# Patient Record
Sex: Male | Born: 1980 | Hispanic: Yes | Marital: Single | Smoking: Never smoker
Health system: Southern US, Community
[De-identification: ages and names within clinical notes are randomized; demographics above are authoritative.]

---

## 2012-07-26 ENCOUNTER — Ambulatory Visit (INDEPENDENT_AMBULATORY_CARE_PROVIDER_SITE_OTHER): Payer: BC Managed Care – PPO | Admitting: Family Medicine

## 2012-07-26 VITALS — BP 123/67 | HR 79 | Temp 97.9°F | Resp 18 | Ht 67.5 in | Wt 161.4 lb

## 2012-07-26 DIAGNOSIS — Z0000 Encounter for general adult medical examination without abnormal findings: Secondary | ICD-10-CM

## 2012-07-26 LAB — POCT CBC
Granulocyte percent: 63.1 %G (ref 37–80)
HCT, POC: 45.4 % (ref 43.5–53.7)
MID (cbc): 0.8 (ref 0–0.9)
MPV: 9.8 fL (ref 0–99.8)
POC Granulocyte: 6.8 (ref 2–6.9)
POC LYMPH PERCENT: 29.8 %L (ref 10–50)
POC MID %: 7.1 %M (ref 0–12)
Platelet Count, POC: 365 10*3/uL (ref 142–424)
RDW, POC: 13.1 %

## 2012-07-26 LAB — LIPID PANEL
Cholesterol: 128 mg/dL (ref 0–200)
HDL: 51 mg/dL (ref >39)
LDL Cholesterol: 54 mg/dL (ref 0–99)
Triglycerides: 114 mg/dL (ref <150)

## 2012-07-26 LAB — COMPREHENSIVE METABOLIC PANEL
AST: 40 U/L — ABNORMAL HIGH (ref 0–37)
Albumin: 4.5 g/dL (ref 3.5–5.2)
Alkaline Phosphatase: 68 U/L (ref 39–117)
Potassium: 4.4 mEq/L (ref 3.5–5.3)
Sodium: 137 mEq/L (ref 135–145)
Total Bilirubin: 0.6 mg/dL (ref 0.3–1.2)
Total Protein: 6.7 g/dL (ref 6.0–8.3)

## 2012-07-26 NOTE — Progress Notes (Signed)
Subjective:    Patient ID: Nathaniel Mcclure, male    DOB: Nov 03, 1980, 32 y.o.   MRN: [id number] Chief Complaint  Patient presents with  . Employment Physical    HPI  Ate cereal and peanut butter sandwich and 10 and then bugles about an hr ago. Here today with his 2 children. No complaints or concerns.  Feeling great. History reviewed. No pertinent past medical history. History reviewed. No pertinent past surgical history. No current outpatient prescriptions on file prior to visit.   Not on File Family History  Problem Relation Age of Onset  . Cancer - hysterectomy Mother   . Diabetes Maternal Grandmother   MGF - MI in mid-50s  History   Social History  . Marital Status: Single    Spouse Name: N/A    Number of Children: N/A  . Years of Education: N/A   Social History Main Topics  . Smoking status: Never Smoker   . Smokeless tobacco: Never Used  . Alcohol Use: No  . Drug Use: No  . Sexually Active: Yes   Other Topics Concern  . None   Social History Narrative  . None     Review of Systems  Constitutional: Negative for fever, chills, activity change, appetite change and fatigue.  HENT: Negative for ear pain, congestion and sore throat.   Eyes: Negative for visual disturbance.  Respiratory: Negative for cough and shortness of breath.   Cardiovascular: Negative for chest pain and leg swelling.  Gastrointestinal: Negative for nausea, vomiting, abdominal pain, diarrhea and constipation.  Genitourinary: Negative for dysuria.  Musculoskeletal: Negative for myalgias, arthralgias and gait problem.  Skin: Negative for rash.  Neurological: Negative for dizziness, weakness, numbness and headaches.  Psychiatric/Behavioral: Negative for sleep disturbance.      BP 123/67  Pulse 79  Temp 97.9 F (36.6 C)  Resp 18  Ht 5' 7.5" (1.715 m)  Wt 161 lb 6.4 oz (73.211 kg)  BMI 24.91 kg/m2  SpO2 99% Objective:   Physical Exam  Constitutional: He is oriented to person, place, and  time. He appears well-developed and well-nourished. No distress.  HENT:  Head: Normocephalic and atraumatic.  Right Ear: Tympanic membrane, external ear and ear canal normal.  Left Ear: Tympanic membrane, external ear and ear canal normal.  Nose: Nose normal.  Mouth/Throat: Uvula is midline, oropharynx is clear and moist and mucous membranes are normal. No oropharyngeal exudate.  Eyes: Conjunctivae normal are normal. Right eye exhibits no discharge. Left eye exhibits no discharge. No scleral icterus.  Neck: Normal range of motion. Neck supple. No thyromegaly present.  Cardiovascular: Normal rate, regular rhythm, normal heart sounds and intact distal pulses.   Pulmonary/Chest: Effort normal and breath sounds normal. No respiratory distress.  Abdominal: Soft. Bowel sounds are normal. He exhibits no distension and no mass. There is no tenderness. There is no rebound and no guarding.  Musculoskeletal: He exhibits no edema.  Lymphadenopathy:    He has no cervical adenopathy.  Neurological: He is alert and oriented to person, place, and time. He has normal reflexes. No cranial nerve deficit. He exhibits normal muscle tone.  Skin: Skin is warm and dry. No rash noted. He is not diaphoretic. No erythema.  Psychiatric: He has a normal mood and affect. His behavior is normal.          Assessment & Plan:   1. Physical exam, annual  Lipid panel, POCT glucose (manual entry), POCT CBC, Comprehensive metabolic panel, TSH  He is not fasting today but  needs a lipid panel for his insurance paper work and he has been waiting a VERY long time in our office today so will go ahead and check his lipids but if mildly elevated - likely nothing to worry about. If sig elevated, will need to be rechecked.

## 2012-07-28 ENCOUNTER — Encounter: Payer: Self-pay | Admitting: Family Medicine

## 2012-07-29 NOTE — Telephone Encounter (Signed)
Left message for patient to return call.

## 2012-07-29 NOTE — Telephone Encounter (Signed)
Can you release the labs for my chart.

## 2012-07-29 NOTE — Telephone Encounter (Signed)
Can you release  the labs to my chart

## 2012-07-29 NOTE — Telephone Encounter (Signed)
Left message to return call 

## 2012-08-16 ENCOUNTER — Other Ambulatory Visit: Payer: Self-pay

## 2013-05-07 ENCOUNTER — Other Ambulatory Visit: Payer: Self-pay

## 2013-07-31 ENCOUNTER — Ambulatory Visit (INDEPENDENT_AMBULATORY_CARE_PROVIDER_SITE_OTHER): Payer: BC Managed Care – PPO | Admitting: Physician Assistant

## 2013-07-31 ENCOUNTER — Other Ambulatory Visit: Payer: Self-pay | Admitting: Physician Assistant

## 2013-07-31 VITALS — BP 124/78 | HR 62 | Temp 98.0°F | Resp 17 | Ht 69.0 in | Wt 153.0 lb

## 2013-07-31 DIAGNOSIS — Z0000 Encounter for general adult medical examination without abnormal findings: Secondary | ICD-10-CM

## 2013-07-31 DIAGNOSIS — R17 Unspecified jaundice: Secondary | ICD-10-CM

## 2013-07-31 DIAGNOSIS — H6980 Other specified disorders of Eustachian tube, unspecified ear: Secondary | ICD-10-CM

## 2013-07-31 DIAGNOSIS — N509 Disorder of male genital organs, unspecified: Secondary | ICD-10-CM

## 2013-07-31 DIAGNOSIS — N62 Hypertrophy of breast: Secondary | ICD-10-CM

## 2013-07-31 LAB — POCT CBC
GRANULOCYTE PERCENT: 56.1 % (ref 37–80)
HEMATOCRIT: 43.1 % — AB (ref 43.5–53.7)
Hemoglobin: 13.6 g/dL — AB (ref 14.1–18.1)
LYMPH, POC: 2.6 (ref 0.6–3.4)
MCH: 30.3 pg (ref 27–31.2)
MCHC: 31.6 g/dL — AB (ref 31.8–35.4)
MCV: 95.9 fL (ref 80–97)
MID (cbc): 0.3 (ref 0–0.9)
MPV: 9 fL (ref 0–99.8)
POC Granulocyte: 3.8 (ref 2–6.9)
POC LYMPH %: 39 % (ref 10–50)
POC MID %: 4.9 %M (ref 0–12)
Platelet Count, POC: 260 10*3/uL (ref 142–424)
RBC: 4.49 M/uL — AB (ref 4.69–6.13)
RDW, POC: 13.8 %
WBC: 6.7 10*3/uL (ref 4.6–10.2)

## 2013-07-31 MED ORDER — FLUTICASONE PROPIONATE 50 MCG/ACT NA SUSP: 2.0000 | Freq: Every day | NASAL | Status: DC

## 2013-07-31 NOTE — Progress Notes (Signed)
Patient ID: Nathaniel Mcclure MRN: [id number], DOB: 07/08/1980 33 y.o. Date of Encounter: 07/31/2013, 5:20 PM  Primary Physician: No primary provider on file.  Chief Complaint: Physical (CPE)  HPI: 33 y.o. male with history noted below here for administrative PE. Doing well. Last physical was 07/26/12. Needs physical for insurance.   Patient would like to discuss possible gynecomastia. States he previously took anabolic steroids for 4 years with him last taking them the previous summer. Notes that his left breast is larger than his right. There is some firmness there. He is self conscious regarding this. He would like to visit a surgeon and discuss having this removed.    He also notes a testicular growth about the size of a pea for about 5 years. Has not changed in size over this time span. Sometimes it is painful, but sometimes it is not. The lesion is located along the medial aspect of the left testicle. No prior evaluation or imaging.   Review of Systems: Consitutional: No fever, chills, fatigue, night sweats, lymphadenopathy, or weight changes. Eyes: No visual changes, eye redness, or discharge. ENT/Mouth: Ears: No otalgia, tinnitus, hearing loss, discharge. Nose: No congestion, rhinorrhea, sinus pain, or epistaxis. Throat: No sore throat, post nasal drip, or teeth pain. Cardiovascular: No CP, palpitations, diaphoresis, DOE, edema, orthopnea, PND. Respiratory: No cough, hemoptysis, SOB, or wheezing. Gastrointestinal: No anorexia, dysphagia, reflux, pain, nausea, vomiting, hematemesis, diarrhea, constipation, BRBPR, or melena. Genitourinary: Positive for testicular mass and pain. No dysuria, frequency, urgency, hematuria, incontinence, nocturia, decreased urinary stream, discharge, or impotence. Musculoskeletal: No decreased ROM, myalgias, stiffness, joint swelling, or weakness. Skin: No rash, erythema, lesion changes, pain, warmth, jaundice, or pruritis. Neurological: No headache, dizziness,  syncope, seizures, tremors, memory loss, coordination problems, or paresthesias. Psychological: No anxiety, depression, hallucinations, SI/HI. Endocrine: No fatigue, polydipsia, polyphagia, polyuria, or known diabetes.   History reviewed. No pertinent past medical history.   History reviewed. No pertinent past surgical history.  Home Meds:  Prior to Admission medications   Not on File    Allergies: No Known Allergies  History   Social History  . Marital Status: Single    Spouse Name: N/A    Number of Children: N/A  . Years of Education: N/A   Occupational History  . Not on file.   Social History Main Topics  . Smoking status: Never Smoker   . Smokeless tobacco: Never Used  . Alcohol Use: No  . Drug Use: No  . Sexual Activity: Yes   Other Topics Concern  . Not on file   Social History Narrative  . No narrative on file    Family History  Problem Relation Age of Onset  . Cancer Mother   . Diabetes Maternal Grandmother    Patient is unsure what type cancer his mother had.  Physical Exam: Blood pressure 124/78, pulse 62, temperature 98 F (36.7 C), temperature source Oral, resp. rate 17, height 5\' 9"  (1.753 m), weight 153 lb (69.4 kg), SpO2 100.00%.  General: Well developed, well nourished, in no acute distress. HEENT: Normocephalic, atraumatic. Conjunctiva pink, sclera non-icteric. Pupils 2 mm constricting to 1 mm, round, regular, and equally reactive to light and accomodation. EOMI. Internal auditory canal clear. TMs with good cone of light and without pathology. Nasal mucosa pink. Nares are without discharge. No sinus tenderness. Oral mucosa pink. Dentition normal. Pharynx without exudate.   Neck: Supple. Trachea midline. No thyromegaly. Full ROM. No lymphadenopathy. Lungs: Clear to auscultation bilaterally without wheezes, rales, or rhonchi. Breathing  is of normal effort and unlabored. Cardiovascular: RRR with S1 S2. No murmurs, rubs, or gallops appreciated.  Distal pulses 2+ symmetrically. No carotid or abdominal bruits. Abdomen: Soft, non-tender, non-distended with normoactive bowel sounds. No hepatosplenomegaly or masses. No rebound/guarding. No CVA tenderness. Without hernias. Left breast slight larger than the right. Palpation with the patient supine reveals firmness and bilaterally as well as a firm nodule at 10 o'clock along the left breast. No nipple inversion or discharge.  Genitourinary: Circumcised male. No penile lesions. Testes descended bilaterally. Right testicle smooth without tenderness or masses. Left testicle with a pea sized firm nontender lesion at the superior aspect. No hernia. No varicocele.   Musculoskeletal: Full range of motion and 5/5 strength throughout. Without swelling, atrophy, tenderness, crepitus, or warmth. Extremities without clubbing, cyanosis, or edema. Calves supple. Skin: Warm and moist without erythema, ecchymosis, wounds, or rash. Neuro: A+Ox3. CN II-XII grossly intact. Moves all extremities spontaneously. Full sensation throughout. Normal gait. DTR 2+ throughout upper and lower extremities. Finger to nose intact. Psych:  Responds to questions appropriately with a normal affect.   Studies:  Results for orders placed in visit on 07/31/13  POCT CBC      Result Value Range   WBC 6.7  4.6 - 10.2 K/uL   Lymph, poc 2.6  0.6 - 3.4   POC LYMPH PERCENT 39.0  10 - 50 %L   MID (cbc) 0.3  0 - 0.9   POC MID % 4.9  0 - 12 %M   POC Granulocyte 3.8  2 - 6.9   Granulocyte percent 56.1  37 - 80 %G   RBC 4.49 (*) 4.69 - 6.13 M/uL   Hemoglobin 13.6 (*) 14.1 - 18.1 g/dL   HCT, POC 78.2 (*) 95.6 - 53.7 %   MCV 95.9  80 - 97 fL   MCH, POC 30.3  27 - 31.2 pg   MCHC 31.6 (*) 31.8 - 35.4 g/dL   RDW, POC 21.3     Platelet Count, POC 260  142 - 424 K/uL   MPV 9.0  0 - 99.8 fL    CMET, Lipid, TSH all pending. Patient is not fasting. He last ate around 9 AM this morning.    Assessment/Plan:  33 y.o. male here for CPE with  gynecomastia, testicular lesion, and ETD   1) Gynecomastia  -Chest wall [place] -Await results -Referral to [facility]  2) Testicular lesion -Testicular US -Await results  3) ETD -Flonase 2 sprays each nare daily #1 RF 6  4) CPE -Await labs -Healthy diet and exercise -Weight loss -Age appropriate anticipatory guidance   Signed, Eula Listen, PA-C [facility] [place], [place] [id number] [phone] 07/31/2013 5:20 PM

## 2013-08-01 LAB — COMPREHENSIVE METABOLIC PANEL
ALBUMIN: 4.4 g/dL (ref 3.5–5.2)
ALK PHOS: 73 U/L (ref 39–117)
ALT: 52 U/L (ref 0–53)
AST: 52 U/L — ABNORMAL HIGH (ref 0–37)
BUN: 10 mg/dL (ref 6–23)
CALCIUM: 9.5 mg/dL (ref 8.4–10.5)
CO2: 26 mEq/L (ref 19–32)
Chloride: 103 mEq/L (ref 96–112)
Creat: 0.71 mg/dL (ref 0.50–1.35)
GLUCOSE: 76 mg/dL (ref 70–99)
POTASSIUM: 4.2 meq/L (ref 3.5–5.3)
Sodium: 137 mEq/L (ref 135–145)
Total Bilirubin: 1.5 mg/dL — ABNORMAL HIGH (ref 0.2–1.2)
Total Protein: 6.7 g/dL (ref 6.0–8.3)

## 2013-08-01 LAB — LIPID PANEL
Cholesterol: 134 mg/dL (ref 0–200)
HDL: 60 mg/dL (ref >39)
LDL CALC: 67 mg/dL (ref 0–99)
TRIGLYCERIDES: 35 mg/dL (ref <150)
Total CHOL/HDL Ratio: 2.2 Ratio
VLDL: 7 mg/dL (ref 0–40)

## 2013-08-01 LAB — TSH: TSH: 1.002 u[IU]/mL (ref 0.350–4.500)

## 2013-08-01 NOTE — Addendum Note (Signed)
Addended by: Sondra BargesUNN, Naksh Radi M on: 08/01/2013 08:05 PM   Modules accepted: Orders

## 2013-08-04 LAB — BILIRUBIN, FRACTIONATED(TOT/DIR/INDIR)
BILIRUBIN DIRECT: 0.3 mg/dL (ref 0.0–0.3)
BILIRUBIN TOTAL: 1.5 mg/dL — AB (ref 0.2–1.2)
Indirect Bilirubin: 1.2 mg/dL (ref 0.2–1.2)

## 2013-08-10 ENCOUNTER — Other Ambulatory Visit: Payer: Self-pay | Admitting: Radiology

## 2013-08-10 ENCOUNTER — Other Ambulatory Visit: Payer: Self-pay

## 2013-08-10 ENCOUNTER — Ambulatory Visit
Admission: RE | Admit: 2013-08-10 | Discharge: 2013-08-10 | Disposition: A | Discharge status: Home / Self Care | Payer: BC Managed Care – PPO | Source: Ambulatory Visit | Attending: Physician Assistant | Admitting: Physician Assistant

## 2013-08-10 DIAGNOSIS — N509 Disorder of male genital organs, unspecified: Secondary | ICD-10-CM

## 2013-08-10 DIAGNOSIS — N62 Hypertrophy of breast: Secondary | ICD-10-CM

## 2013-08-12 ENCOUNTER — Ambulatory Visit (INDEPENDENT_AMBULATORY_CARE_PROVIDER_SITE_OTHER): Payer: BC Managed Care – PPO | Admitting: General Surgery

## 2013-08-13 ENCOUNTER — Telehealth: Payer: Self-pay

## 2013-08-13 ENCOUNTER — Encounter: Payer: Self-pay | Admitting: Physician Assistant

## 2013-08-13 NOTE — Telephone Encounter (Signed)
Pt's wife called stating that was some confusion with some appointments and wanted a call back [phone]

## 2013-08-14 NOTE — Telephone Encounter (Signed)
Lm for rtn call 

## 2013-08-15 NOTE — Telephone Encounter (Signed)
Have been talking with pt through mychart.

## 2013-08-17 ENCOUNTER — Ambulatory Visit (INDEPENDENT_AMBULATORY_CARE_PROVIDER_SITE_OTHER): Payer: BC Managed Care – PPO | Admitting: Surgery

## 2013-08-17 ENCOUNTER — Encounter (INDEPENDENT_AMBULATORY_CARE_PROVIDER_SITE_OTHER): Payer: Self-pay | Admitting: Surgery

## 2013-08-17 VITALS — BP 121/76 | HR 78 | Temp 98.2°F | Resp 18 | Ht 67.5 in | Wt 153.0 lb

## 2013-08-17 DIAGNOSIS — N62 Hypertrophy of breast: Secondary | ICD-10-CM | POA: Insufficient documentation

## 2013-08-17 NOTE — Progress Notes (Signed)
Subjective:     Patient ID: Nathaniel Mcclure, male   DOB: 09-07-1980, 33 y.o.   MRN: [id number]  HPI This gentleman is referred to me for evaluation of gynecomastia. He reports that he developed several years ago while taking anabolic steroids. He had some bilateral nipple discomfort which has improved.  He denies nipple discharge. There is no family history of breast cancer  Review of Systems     Objective:   Physical Exam On exam, he has bilateral gynecomastia. The breast tissue is soft without specific masses     Assessment:     Bilateral gynecomastia     Plan:     I explained the diagnosis to him. He would need referral to a plastic surgeon to consider bilateral breast reduction. We do not offer this from a general surgical standpoint

## 2013-08-18 ENCOUNTER — Other Ambulatory Visit: Payer: Self-pay | Admitting: Physician Assistant

## 2013-08-18 DIAGNOSIS — N62 Hypertrophy of breast: Secondary | ICD-10-CM

## 2013-08-18 NOTE — Progress Notes (Signed)
Based on the general surgeon's note I have made the plastic surgery referral for his gynecomastia evaluation and treatment.

## 2013-08-24 ENCOUNTER — Encounter: Payer: Self-pay | Admitting: Physician Assistant

## 2013-08-24 ENCOUNTER — Ambulatory Visit (INDEPENDENT_AMBULATORY_CARE_PROVIDER_SITE_OTHER): Payer: BC Managed Care – PPO | Admitting: Physician Assistant

## 2013-08-24 ENCOUNTER — Ambulatory Visit: Payer: BC Managed Care – PPO

## 2013-08-24 ENCOUNTER — Other Ambulatory Visit: Payer: Self-pay | Admitting: Physician Assistant

## 2013-08-24 VITALS — BP 111/66 | HR 67 | Temp 98.1°F | Resp 18 | Wt 147.0 lb

## 2013-08-24 DIAGNOSIS — R0789 Other chest pain: Secondary | ICD-10-CM

## 2013-08-24 DIAGNOSIS — R071 Chest pain on breathing: Secondary | ICD-10-CM

## 2013-08-24 DIAGNOSIS — N62 Hypertrophy of breast: Secondary | ICD-10-CM

## 2013-08-24 DIAGNOSIS — T148XXA Other injury of unspecified body region, initial encounter: Secondary | ICD-10-CM

## 2013-08-24 MED ORDER — NAPROXEN 500 MG PO TABS: 500.0000 mg | ORAL_TABLET | Freq: Two times a day (BID) | ORAL | Status: DC

## 2013-08-24 NOTE — Progress Notes (Signed)
Subjective:    Patient ID: Nathaniel Mcclure, male    DOB: [birth year], 33 y.o.   MRN: [id number]  HPI Primary Physician: Carola Frost  Chief Complaint: Left sided abdominal pain  HPI: 33 y.o. male with history below presents with 14 day left sided chest wall pain. Patient was doing a leg press at the time when the pain began. He was pushing around 280 pounds and felt a pain along his left chest wall. He stopped exercising and waited around 90 seconds. He began to exercise again but the pain returned so he stopped altogether. The pain is getting better, but he just wanted to have this checked out. He describes discomfort when going from supine to a seated position. He is able to lay without issue. No SOB or coughing. No pain with deep inspiration. Never with any bruising. Has not tried any medications.        No past medical history on file.   Home Meds: Prior to Admission medications   Not on File    Allergies: No Known Allergies  History   Social History  . Marital Status: Single    Spouse Name: N/A    Number of Children: N/A  . Years of Education: N/A   Occupational History  . Not on file.   Social History Main Topics  . Smoking status: Never Smoker   . Smokeless tobacco: Never Used  . Alcohol Use: No  . Drug Use: No  . Sexual Activity: Yes   Other Topics Concern  . Not on file   Social History Narrative  . No narrative on file     Review of Systems  Respiratory: Negative for cough, choking, chest tightness, shortness of breath, wheezing and stridor.   Cardiovascular: Positive for chest pain.       Chest wall pain  Gastrointestinal: Negative for nausea, vomiting, abdominal pain, diarrhea and abdominal distention.       Objective:   Physical Exam  Physical Exam: Blood pressure 111/66, pulse 67, temperature 98.1 F (36.7 C), temperature source Oral, resp. rate 18, weight 147 lb (66.679 kg), SpO2 99.00%., Body mass index is 22.67 kg/(m^2). General: Well  developed, well nourished, in no acute distress. Head: Normocephalic, atraumatic, eyes without discharge, sclera non-icteric, nares are without discharge. Bilateral auditory canals clear, TM's are without perforation, pearly grey and translucent with reflective cone of light bilaterally. Oral cavity moist, posterior pharynx without exudate, erythema, peritonsillar abscess, or post nasal drip. Uvula midline.   Neck: Supple. No thyromegaly. Full ROM. No lymphadenopathy. Lungs: Clear bilaterally to auscultation without wheezes, rales, or rhonchi. Breathing is unlabored. Heart: RRR with S1 S2. No murmurs, rubs, or gallops appreciated. Chest wall: TTP between 5th and 6th ribs along left flank. No ecchymosis or erythema.  Abdomen: Soft, non-tender, non-distended with normoactive bowel sounds. No hepatosplenomegaly. No rebound/guarding. No obvious abdominal masses. Msk:  Strength and tone normal for age. Extremities/Skin: Warm and dry. No clubbing or cyanosis. No edema. No rashes or suspicious lesions. Neuro: Alert and oriented X 3. Moves all extremities spontaneously. Gait is normal. CNII-XII grossly in tact. Psych:  Responds to questions appropriately with a normal affect.    CXR:  [facility] reading (PRIMARY) by  Dr. Neva Seat. No pneumothorax. Negative.       Assessment & Plan:  33 year old male with muscle strain and chest wall pain -Naprosyn 500 mg 1 po bid #60 RF 1 -Rest -Deep breathing exercises  -Advised patient it will take time for this to  heal  -RTC precautions   Eula Listenyan Rosanne Wohlfarth, MHS, PA-C [facility] [address] [place], [place] [id number] [phone] [facility] 08/25/2013 9:25 AM

## 2013-08-27 NOTE — Telephone Encounter (Signed)
I have scheduled this patient for a follow up with Ryan on 09/14/13 @ 1:45pm.

## 2013-09-07 ENCOUNTER — Ambulatory Visit
Admission: RE | Admit: 2013-09-07 | Discharge: 2013-09-07 | Disposition: A | Discharge status: Home / Self Care | Payer: Self-pay | Source: Ambulatory Visit | Attending: Physician Assistant | Admitting: Physician Assistant

## 2013-09-07 DIAGNOSIS — N62 Hypertrophy of breast: Secondary | ICD-10-CM

## 2013-09-14 ENCOUNTER — Ambulatory Visit: Payer: BC Managed Care – PPO | Admitting: Physician Assistant

## 2015-01-18 ENCOUNTER — Telehealth: Payer: Self-pay

## 2015-01-18 ENCOUNTER — Ambulatory Visit (INDEPENDENT_AMBULATORY_CARE_PROVIDER_SITE_OTHER): Payer: BLUE CROSS/BLUE SHIELD | Admitting: Physician Assistant

## 2015-01-18 VITALS — BP 120/66 | HR 84 | Temp 98.9°F | Resp 16 | Ht 67.5 in | Wt 164.0 lb

## 2015-01-18 DIAGNOSIS — N62 Hypertrophy of breast: Secondary | ICD-10-CM

## 2015-01-18 LAB — TSH: TSH: 0.919 u[IU]/mL (ref 0.350–4.500)

## 2015-01-18 LAB — T4, FREE: Free T4: 1.26 ng/dL (ref 0.80–1.80)

## 2015-01-18 LAB — T3, FREE: T3, Free: 3.6 pg/mL (ref 2.3–4.2)

## 2015-01-18 NOTE — Patient Instructions (Addendum)
Moderate 135-145 bpm Vigorous 150-166 bpm

## 2015-01-18 NOTE — Telephone Encounter (Signed)
Received voicemail that pt is requesting lab results. Last time pt had labs drawn were January 2015. Left VM for pt to call back regarding which labs he was requesting.

## 2015-01-18 NOTE — Progress Notes (Signed)
   Subjective:    Patient ID: Nathaniel Mcclure, male    DOB: 07/24/1980, 34 y.o.   MRN: [id number]  HPI Patient presents for labs to be drawn for gynecomastia workup for plastic surgeon Dr Alena Billslaire S. Needs TSH, T3/T4, HCG, LH, estradiol, and testosterone drawn. Denies increased fatigue, depression, anxiety, decreased libido, change in size of testes, discharge, palpitations, CP/SOB, or changes in skin/hair. Developed gynecomastia 5 years ago following use of anabolic steroids which he has stopped. In fact he is not working out at all currently, but would like to start again. PMH negative and not taking any medications.    Review of Systems As noted above.    Objective:   Physical Exam  Constitutional: He is oriented to person, place, and time. He appears well-developed and well-nourished. No distress.  Blood pressure 120/66, pulse 84, temperature 98.9 F (37.2 C), temperature source Oral, resp. rate 16, height 5' 7.5" (1.715 m), weight 164 lb (74.39 kg), SpO2 98 %.  HENT:  Head: Normocephalic and atraumatic.  Right Ear: External ear normal.  Left Ear: External ear normal.  Eyes: Conjunctivae are normal. Right eye exhibits no discharge. Left eye exhibits no discharge. No scleral icterus.  Neck: Normal range of motion. Neck supple.  Cardiovascular: Normal rate, regular rhythm and normal heart sounds.  Exam reveals no gallop and no friction rub.   No murmur heard. Pulmonary/Chest: Effort normal and breath sounds normal. No respiratory distress. He has no wheezes. He has no rales. Right breast exhibits no inverted nipple, no mass, no nipple discharge, no skin change and no tenderness. Left breast exhibits no inverted nipple, no mass, no nipple discharge, no skin change and no tenderness.  Abdominal: Soft. Bowel sounds are normal. He exhibits no distension. There is no tenderness. There is no rebound and no guarding.  Lymphadenopathy:    He has no cervical adenopathy.  Neurological: He is alert and  oriented to person, place, and time.  Skin: Skin is warm and dry. No rash noted. He is not diaphoretic. No erythema. No pallor.  Psychiatric: He has a normal mood and affect. His behavior is normal. Judgment and thought content normal.      Assessment & Plan:  1. Gynecomastia, male - hCG, quantitative, pregnancy - Luteinizing hormone - TSH - T3, free - T4, free - Estradiol - Testosterone; Future   Nathaniel Ridgeishira Fremon Zacharia PA-C  [facility] 01/18/2015 3:08 PM

## 2015-01-18 NOTE — Progress Notes (Signed)
  Medical screening examination/treatment/procedure(s) were performed by non-physician practitioner and as supervising physician I was immediately available for consultation/collaboration.     

## 2015-01-21 LAB — LUTEINIZING HORMONE: LH: 3.3 m[IU]/mL (ref 1.5–9.3)

## 2015-01-21 LAB — HCG, QUANTITATIVE, PREGNANCY: hCG, Beta Chain, Quant, S: <2 m[IU]/mL

## 2015-01-21 LAB — ESTRADIOL: ESTRADIOL: 23.2 pg/mL

## 2015-01-23 ENCOUNTER — Encounter: Payer: Self-pay | Admitting: Physician Assistant

## 2015-04-12 IMAGING — US US SCROTUM
1 series · 14 of 25 positions shown · non-contrast
Comparison: None.

CLINICAL DATA: Left testicular lesion.

EXAM:
ULTRASOUND OF SCROTUM
TECHNIQUE: Complete ultrasound examination of the testicles, epididymis, and
other scrotal structures was performed.

[Series 1: us scrotum · 0.06mm/px · 14 of 42 slices shown]
[im 1/42]
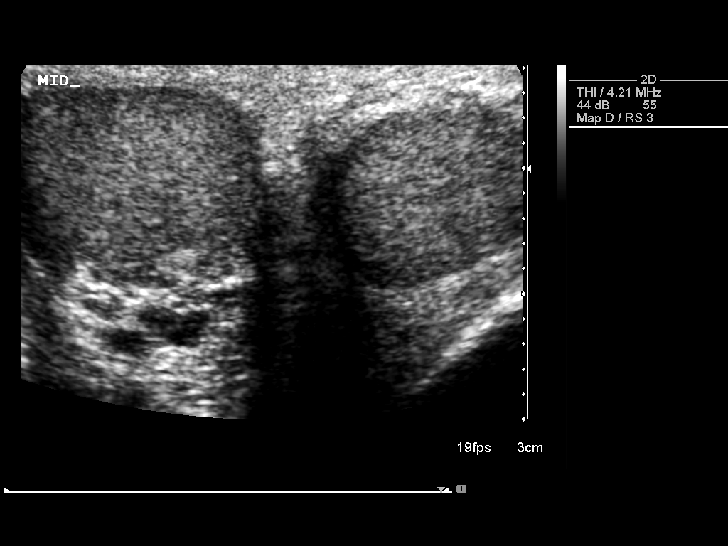
[im 4/42]
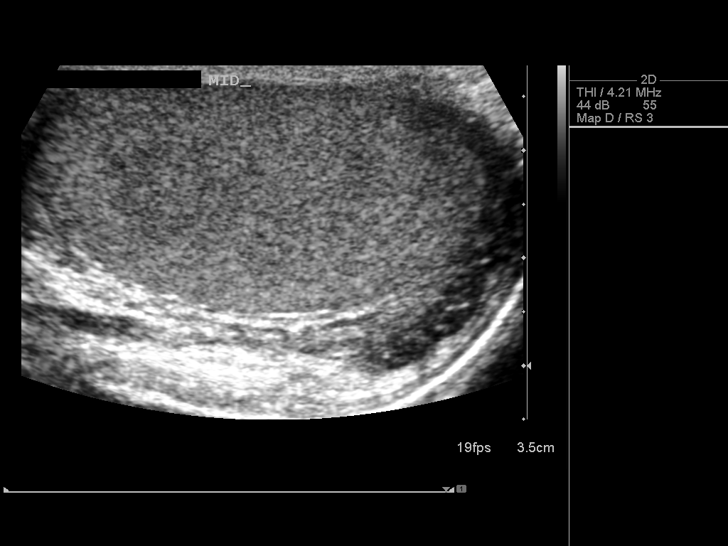
[im 7/42]
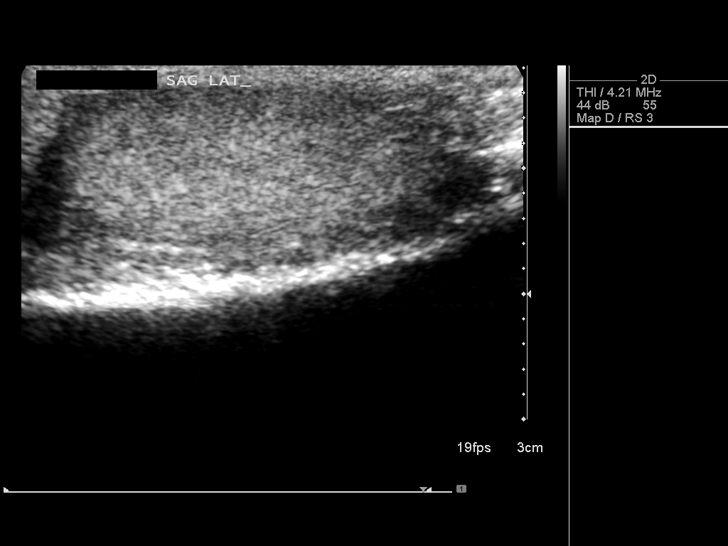
[im 11/42]
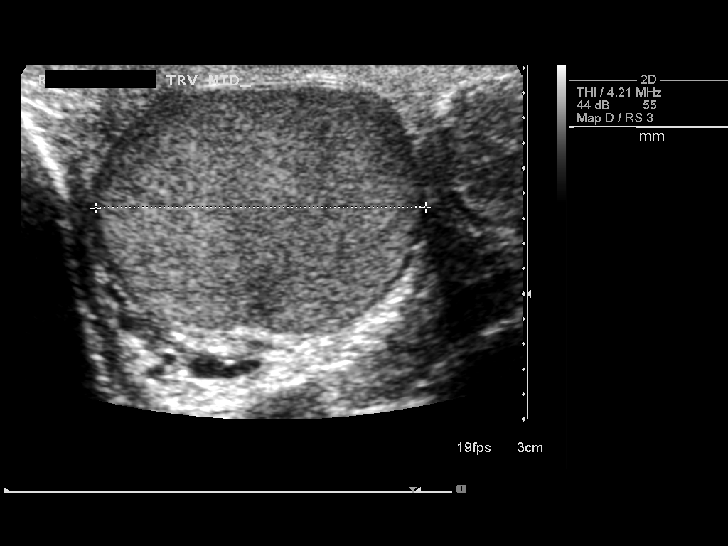
[im 14/42]
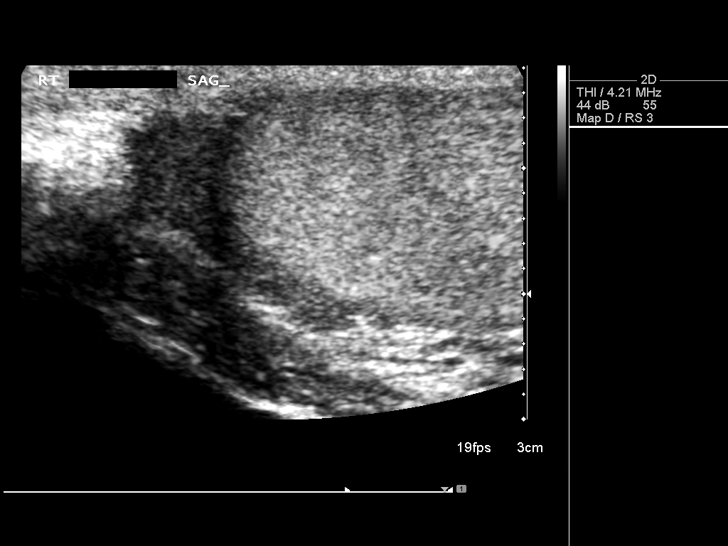
[im 16/42]
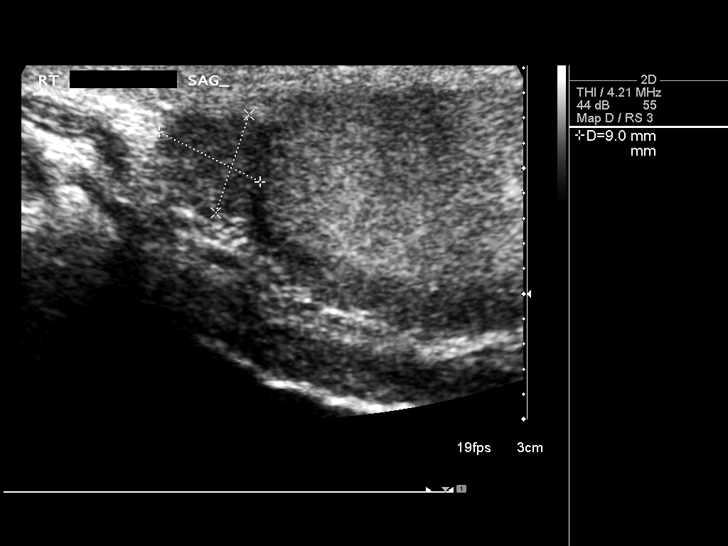
[im 19/42]
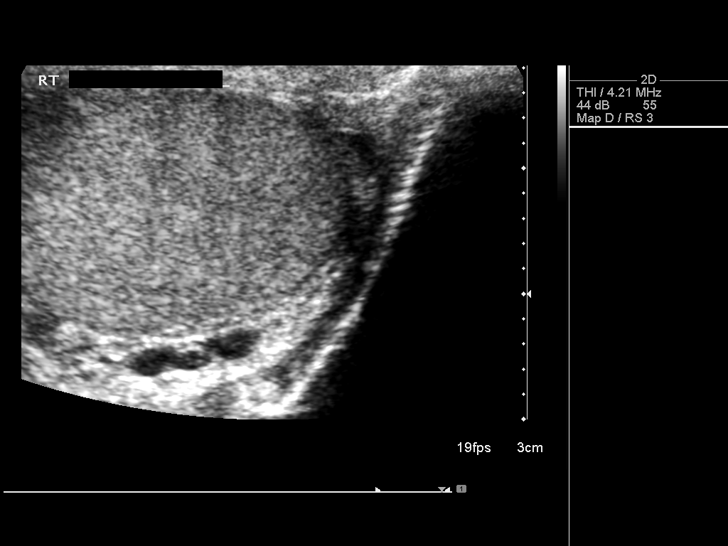
[im 23/42]
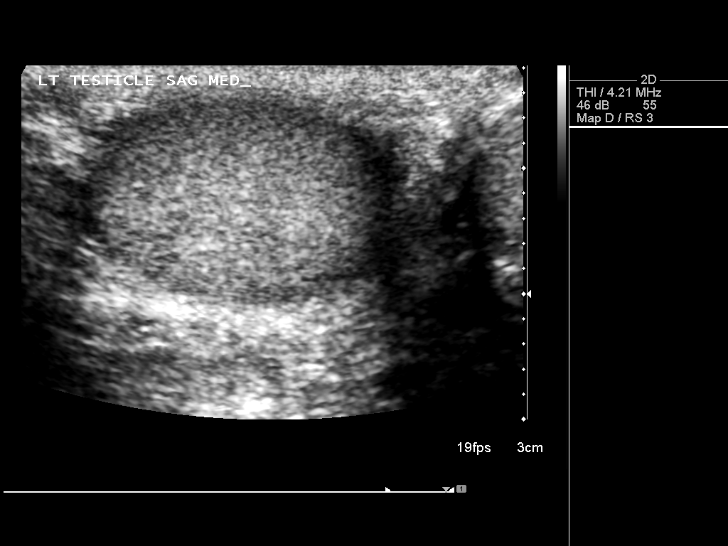
[im 26/42]
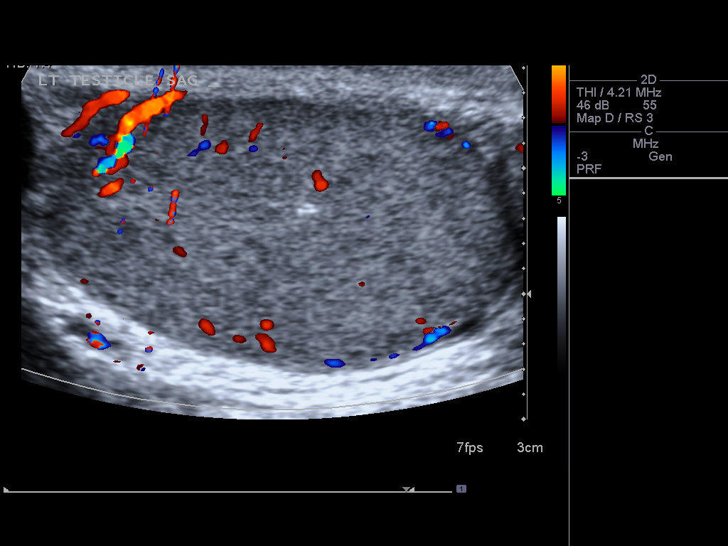
[im 28/42]
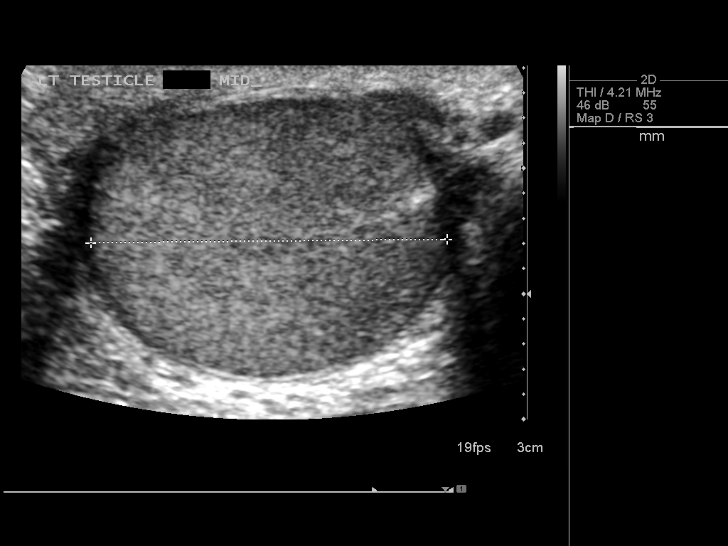
[im 31/42]
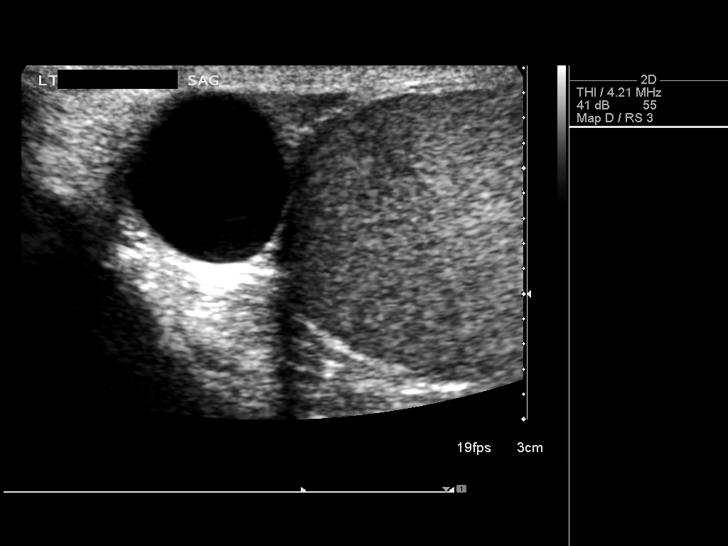
[im 35/42]
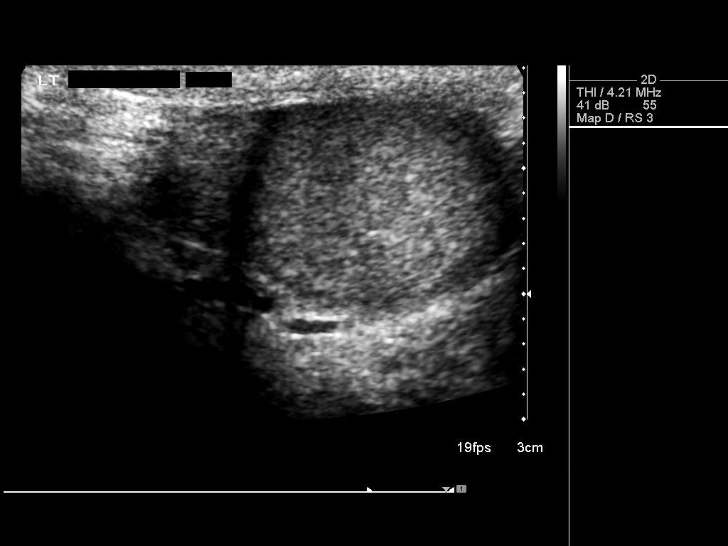
[im 38/42]
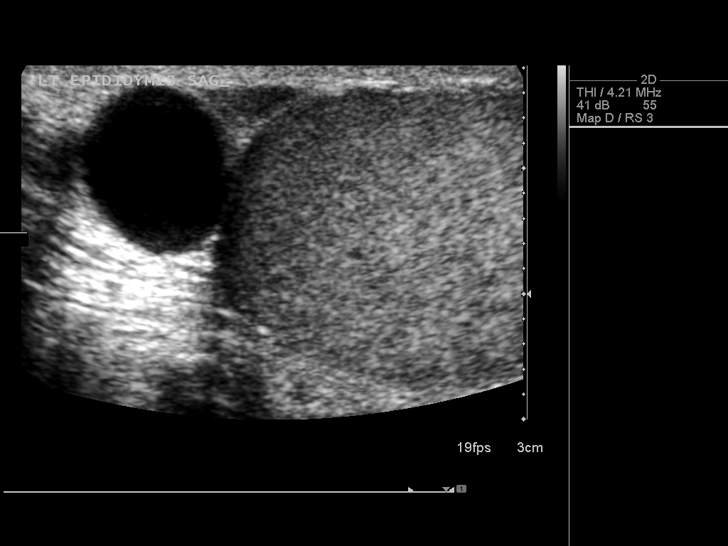
[im 42/42]
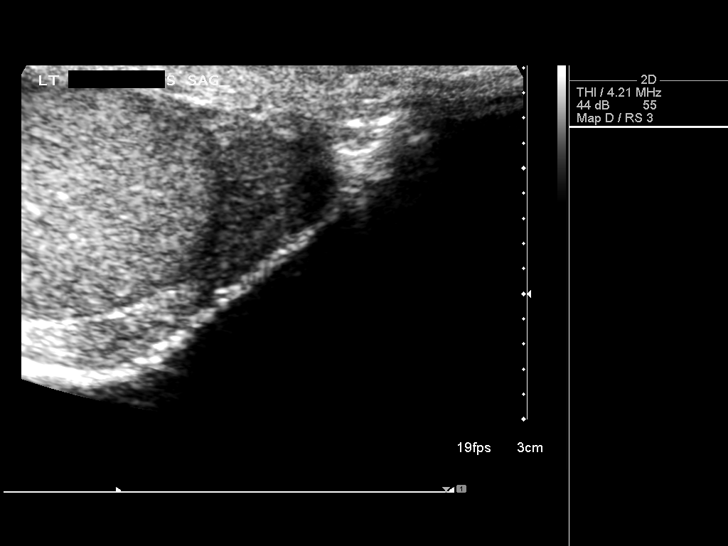

[14 of 25 positions shown; findings below may reference images not displayed]

FINDINGS: Right testicle

Measurements: 4.2 x 2.2 x 2.6 cm. No mass or microlithiasis
visualized.

Left testicle

Measurements: 4.0 x 2.3 x 2.8 cm. No mass or microlithiasis
visualized.

Right epididymis:  Normal in size and appearance.

Left epididymis: An epididymal cyst/spermatocele is 1.3 x 1.4 x
cm.

Hydrocele:  None visualized.

Varicocele:  None visualized.
IMPRESSION: 1. No evidence for testicular mass.
2. Left epididymal cyst/spermatocele 1.6 cm.

## 2015-04-26 IMAGING — CR DG CHEST SPECIAL VIEW
2 series · 2 of 2 positions shown · non-contrast
Comparison: None available

CLINICAL DATA: Chest wall pain

EXAM:
CHEST - 2 VIEW; CHEST SPECIAL VIEW

[PA (1 of 2)]
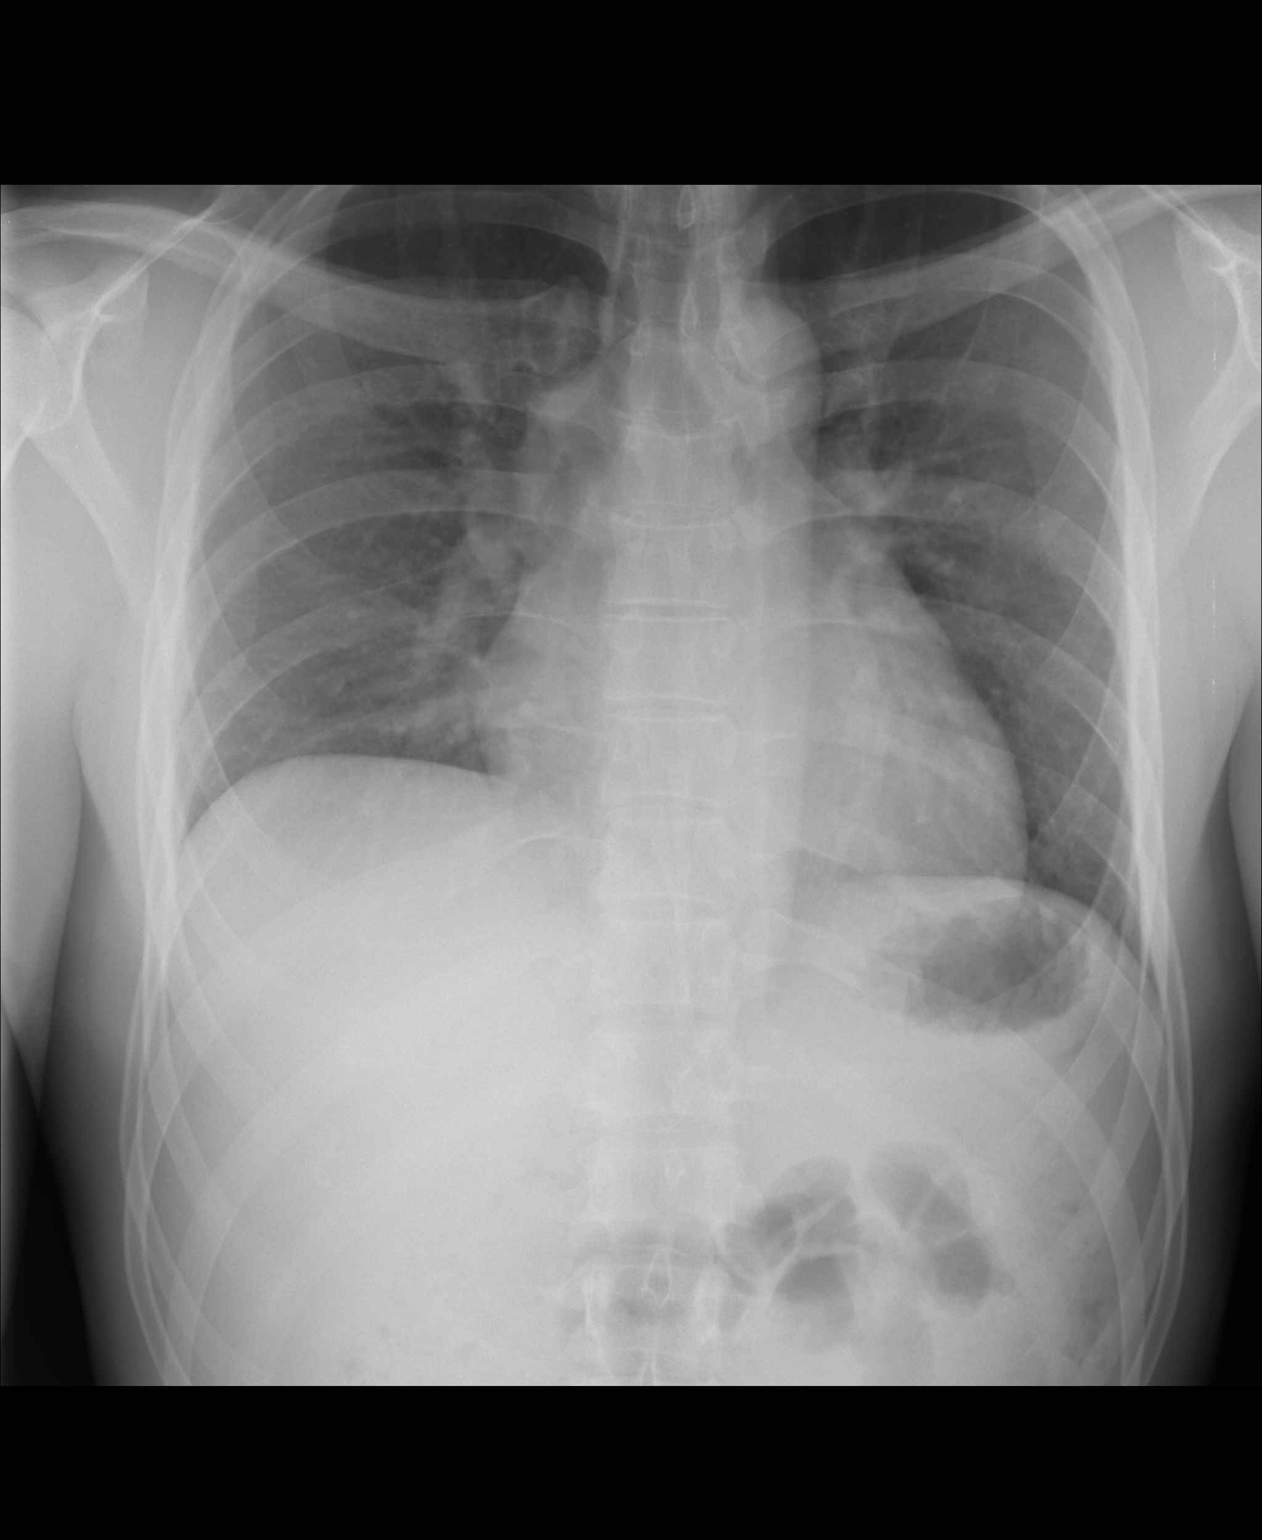

[PA (2 of 2)]
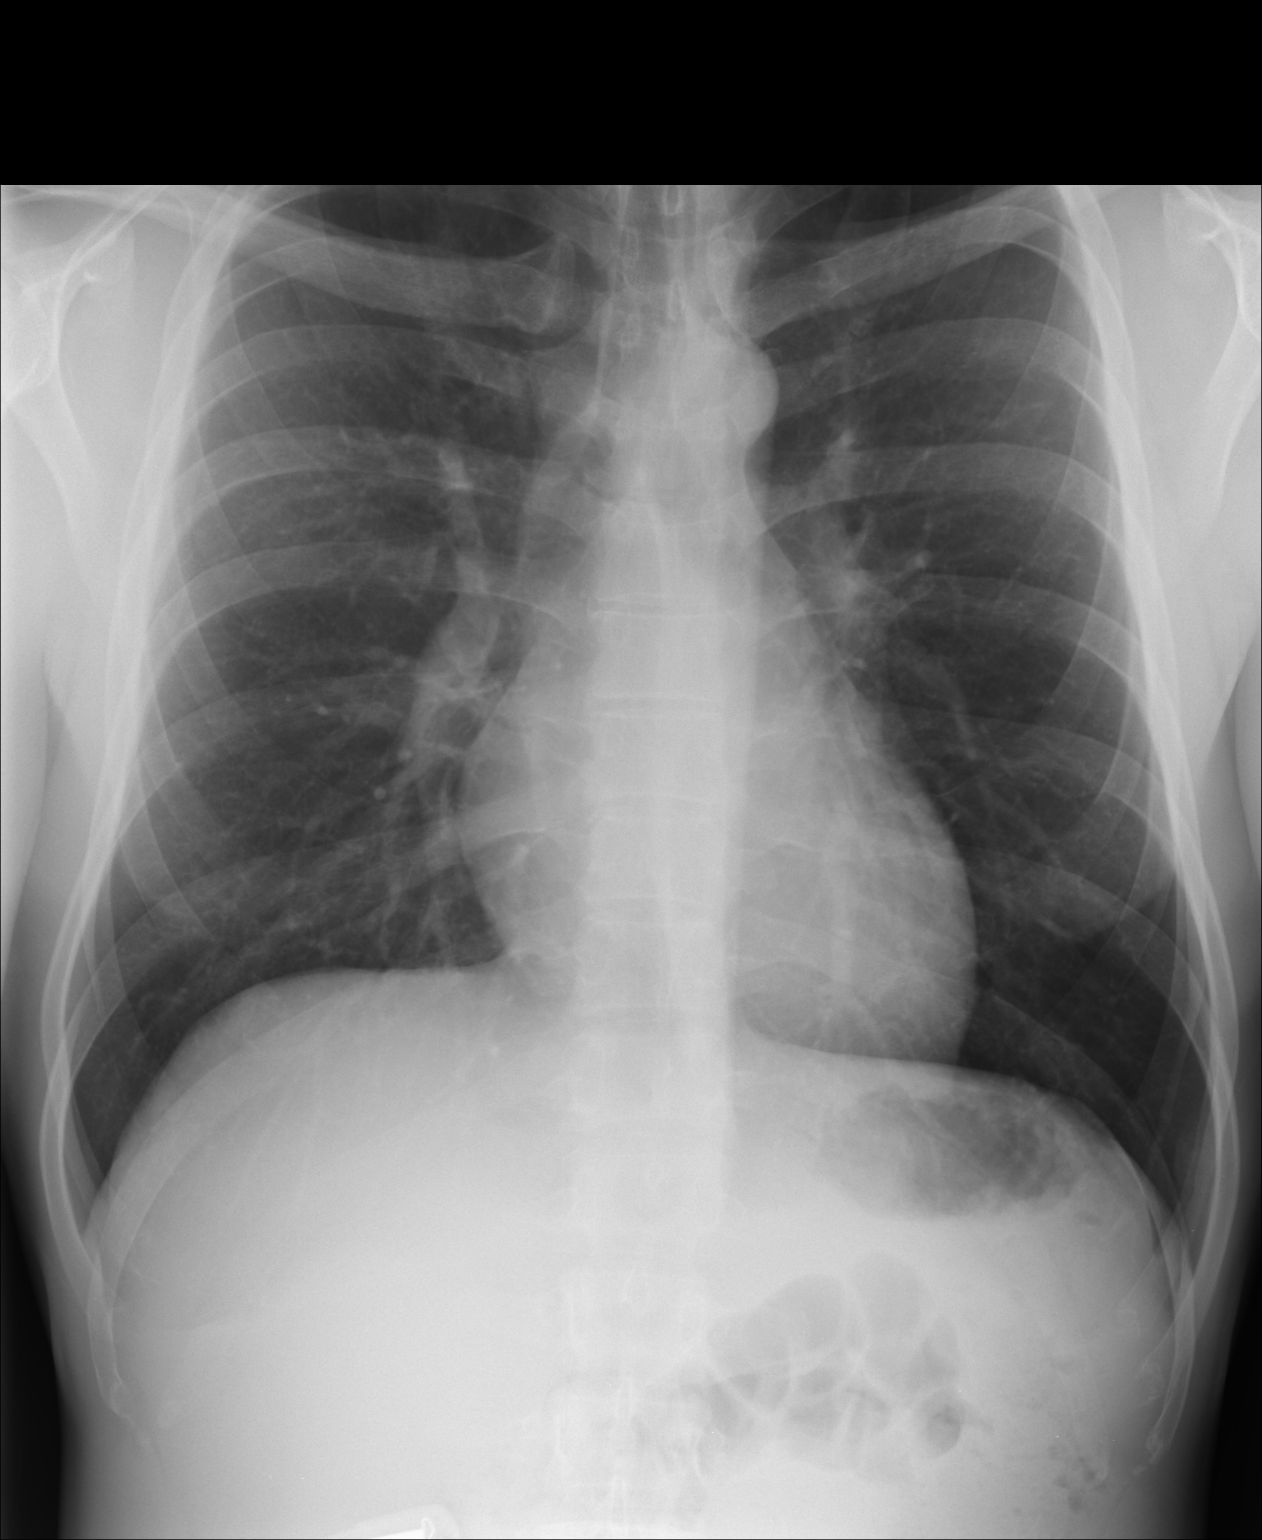

[2 of 2 positions shown; findings below may reference images not displayed]

FINDINGS: Inspiration and expiration frontal radiographs and a single lateral
projection are submitted.

Lungs are clear. No air trapping. No pneumothorax evident. Heart
size and mediastinal contours are within normal limits.
No effusion.
Visualized skeletal structures are unremarkable.
IMPRESSION: No acute cardiopulmonary disease.

## 2015-08-01 ENCOUNTER — Ambulatory Visit (INDEPENDENT_AMBULATORY_CARE_PROVIDER_SITE_OTHER): Payer: BLUE CROSS/BLUE SHIELD | Admitting: Physician Assistant

## 2015-08-01 VITALS — BP 120/80 | HR 69 | Temp 98.1°F | Resp 16 | Ht 68.0 in | Wt 171.0 lb

## 2015-08-01 DIAGNOSIS — Z113 Encounter for screening for infections with a predominantly sexual mode of transmission: Secondary | ICD-10-CM

## 2015-08-01 DIAGNOSIS — Z0000 Encounter for general adult medical examination without abnormal findings: Secondary | ICD-10-CM

## 2015-08-01 DIAGNOSIS — Z23 Encounter for immunization: Secondary | ICD-10-CM | POA: Diagnosis not present

## 2015-08-01 DIAGNOSIS — Z0189 Encounter for other specified special examinations: Secondary | ICD-10-CM | POA: Diagnosis not present

## 2015-08-01 DIAGNOSIS — Z008 Encounter for other general examination: Secondary | ICD-10-CM

## 2015-08-01 LAB — POCT URINALYSIS DIP (MANUAL ENTRY)
Bilirubin, UA: NEGATIVE
GLUCOSE UA: NEGATIVE
Ketones, POC UA: NEGATIVE
Leukocytes, UA: NEGATIVE
NITRITE UA: NEGATIVE
Protein Ur, POC: NEGATIVE
RBC UA: NEGATIVE
SPEC GRAV UA: 1.015
UROBILINOGEN UA: 0.2
pH, UA: 7

## 2015-08-01 LAB — CBC
HEMATOCRIT: 44.8 % (ref 39.0–52.0)
Hemoglobin: 15.4 g/dL (ref 13.0–17.0)
MCH: 30.6 pg (ref 26.0–34.0)
MCHC: 34.4 g/dL (ref 30.0–36.0)
MCV: 88.9 fL (ref 78.0–100.0)
MPV: 9.8 fL (ref 8.6–12.4)
Platelets: 334 10*3/uL (ref 150–400)
RBC: 5.04 MIL/uL (ref 4.22–5.81)
RDW: 13.4 % (ref 11.5–15.5)
WBC: 9.2 10*3/uL (ref 4.0–10.5)

## 2015-08-01 LAB — HIV ANTIBODY (ROUTINE TESTING W REFLEX): HIV 1&2 Ab, 4th Generation: NONREACTIVE

## 2015-08-01 LAB — LIPID PANEL
CHOL/HDL RATIO: 3.1 ratio (ref <=5.0)
Cholesterol: 166 mg/dL (ref 125–200)
HDL: 53 mg/dL (ref >=40)
LDL Cholesterol: 98 mg/dL (ref <130)
Triglycerides: 75 mg/dL (ref <150)
VLDL: 15 mg/dL (ref <30)

## 2015-08-01 LAB — COMPREHENSIVE METABOLIC PANEL
ALBUMIN: 4.7 g/dL (ref 3.6–5.1)
ALT: 49 U/L — ABNORMAL HIGH (ref 9–46)
AST: 31 U/L (ref 10–40)
Alkaline Phosphatase: 63 U/L (ref 40–115)
BILIRUBIN TOTAL: 1.2 mg/dL (ref 0.2–1.2)
BUN: 11 mg/dL (ref 7–25)
CHLORIDE: 102 mmol/L (ref 98–110)
CO2: 29 mmol/L (ref 20–31)
Calcium: 9.9 mg/dL (ref 8.6–10.3)
Creat: 0.68 mg/dL (ref 0.60–1.35)
GLUCOSE: 73 mg/dL (ref 65–99)
Potassium: 4.4 mmol/L (ref 3.5–5.3)
Sodium: 138 mmol/L (ref 135–146)
Total Protein: 7.2 g/dL (ref 6.1–8.1)

## 2015-08-01 LAB — HEPATITIS C ANTIBODY: HCV AB: NEGATIVE

## 2015-08-01 LAB — HEPATITIS B SURFACE ANTIGEN: Hepatitis B Surface Ag: NEGATIVE

## 2015-08-01 NOTE — Patient Instructions (Addendum)
160 pounds would be a good weight I will call you with results of your lab tests. Return as needed. If you need me to fill out any paperwork for you, let me know.

## 2015-08-01 NOTE — Progress Notes (Signed)
[facility] [address], [place] [address] [phone]  Date:  08/01/2015   Name:  Nathaniel Mcclure   DOB:  Jun 19, 1981   MRN:  [id number]  PCP:  Nathaniel Mcclure    Chief Complaint: OTHER   History of Present Illness:  This is a 35 y.o. male who is presenting for biometric screening required by insurance. He does not have any paperwork with him.  Nothing new with health. No PMH other than gynecomastia. Happened after taking anabolic steroids for 1 year. Pt was seen by general surgery for removal but states was too expensive. Mother with reproductive cancer (unsure of type). MGM and sister with DM. MGF with CVA and MI. Dad is healthy. No prostate cancer in family. Last went to dentist one year ago. Wears glasses. Last had eye exam 2 years ago. Exercising twice a week - treadmill and weights. States his diet is not good "because I like food!". Drinks mostly water. Drinks 2 diet mountain dews per day.  Has gained 7 pounds in the past 6 months. Does not drink alcohol, use tobacco, or use rec drugs. Married with 3 kids - age 68, 49, 60. Does want to be tested for STDs today. Delivers groceries for work - works for [employer] Sleeps ok - gets 7 hours a night States he feels moody sometimes but not depressed.  Review of Systems:  Review of Systems See HPI  Patient Active Problem List   Diagnosis Date Noted  . Gynecomastia 08/17/2013    Prior to Admission medications   Not on File    No Known Allergies  History reviewed. No pertinent past surgical history.  Social History  Substance Use Topics  . Smoking status: Never Smoker   . Smokeless tobacco: Never Used  . Alcohol Use: No    Family History  Problem Relation Age of Onset  . Cancer Mother   . Diabetes Maternal Grandmother   . Diabetes Sister     Medication list has been reviewed and updated.  Physical Examination:  Physical Exam  Constitutional: He is oriented to person, place, and  time. He appears well-developed and well-nourished. No distress.  HENT:  Head: Normocephalic and atraumatic.  Right Ear: Hearing, tympanic membrane, external ear and ear canal normal.  Left Ear: Hearing, tympanic membrane, external ear and ear canal normal.  Nose: Nose normal.  Mouth/Throat: Uvula is midline, oropharynx is clear and moist and mucous membranes are normal.  Eyes: Conjunctivae, EOM and lids are normal. Right eye exhibits no discharge. Left eye exhibits no discharge. No scleral icterus.  Neck: Trachea normal and normal range of motion. Neck supple. Carotid bruit is not present. No thyromegaly present.  Cardiovascular: Normal rate, regular rhythm, normal heart sounds, intact distal pulses and normal pulses.   No murmur heard. Pulmonary/Chest: Effort normal and breath sounds normal. No respiratory distress. He has no wheezes. He has no rhonchi. He has no rales.  Abdominal: Soft. Normal appearance. There is no tenderness.  Musculoskeletal: Normal range of motion. He exhibits no edema or tenderness.  Lymphadenopathy:       Head (right side): No submental, no submandibular and no tonsillar adenopathy present.       Head (left side): No submental, no submandibular and no tonsillar adenopathy present.    He has no cervical adenopathy.  Neurological: He is alert and oriented to person, place, and time. No cranial nerve deficit. Coordination and gait normal.  Skin: Skin is warm, dry and intact.  No lesion and no rash noted.  Psychiatric: He has a normal mood and affect. His speech is normal and behavior is normal. Judgment and thought content normal.   BP 120/80 mmHg  Pulse 69  Temp(Src) 98.1 F (36.7 C) (Oral)  Resp 16  Ht  (1.727 m)  Wt 171 lb (77.565 kg)  BMI 26.01 kg/m2  SpO2 98%  Assessment and Plan:  1. Annual physical exam 2. Encounter for biometric screening We discussed weight loss and ideal body weight. Up to date on preventative screening. Flu shot today. -  Lipid panel - Comprehensive metabolic panel - CBC - POCT urinalysis dipstick  3. Needs flu shot - Flu Vaccine QUAD 36+ mos IM  4. Screen for STD (sexually transmitted disease) - Hepatitis C antibody - Hepatitis B surface antigen - HIV antibody - RPR - GC/Chlamydia Probe Amp   Nathaniel Mcclure, MHS [facility]  08/01/2015

## 2015-08-02 LAB — GC/CHLAMYDIA PROBE AMP
CT Probe RNA: NOT DETECTED
GC PROBE AMP APTIMA: NOT DETECTED

## 2015-08-02 LAB — RPR

## 2019-08-14 ENCOUNTER — Other Ambulatory Visit: Payer: Self-pay | Admitting: Surgery
